# Patient Record
Sex: Female | Born: 1995 | Race: Black or African American | Hispanic: Yes | Marital: Single | Smoking: Never smoker
Health system: Southern US, Community
[De-identification: ages and names within clinical notes are randomized; demographics above are authoritative.]

---
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
## 2017-11-23 ENCOUNTER — Encounter (INDEPENDENT_AMBULATORY_CARE_PROVIDER_SITE_OTHER): Payer: Self-pay

## 2017-11-23 ENCOUNTER — Ambulatory Visit: Payer: BC Managed Care – PPO | Admitting: Internal Medicine

## 2017-11-23 ENCOUNTER — Encounter: Payer: Self-pay | Admitting: Internal Medicine

## 2017-11-23 VITALS — BP 120/80 | HR 91 | Temp 98.4°F | Ht 68.5 in | Wt 161.0 lb

## 2017-11-23 DIAGNOSIS — K644 Residual hemorrhoidal skin tags: Secondary | ICD-10-CM

## 2017-11-23 DIAGNOSIS — Z3042 Encounter for surveillance of injectable contraceptive: Secondary | ICD-10-CM | POA: Diagnosis not present

## 2017-11-23 LAB — POCT URINE PREGNANCY: Preg Test, Ur: NEGATIVE

## 2017-11-23 MED ORDER — MEDROXYPROGESTERONE ACETATE 150 MG/ML IM SUSP
150.0000 mg | Freq: Once | INTRAMUSCULAR | Status: AC
  Administered 2017-11-23: 150 mg via INTRAMUSCULAR

## 2017-11-23 NOTE — Progress Notes (Signed)
HPI  Pt presents to the clinic today to establish care. She has not had a PCP in many years.  She would like to talk about contraception. She recently became sexually active in May. She would like something to prevent pregnancy. She is currently using condoms. She reports she thought her period was late, but it started this morning.  She also reports intermittent rectal pain with blood with wiping. This has been an ongoing issue since childhood but has actually improved. She used to have issues with constipation which has gotten better with age and changes in diet. She has not tried anything OTC for her symptoms.  Flu: never Tetanus: unsure Pap Smear: never Dentist: as needed  No past medical history on file.  Current Outpatient Medications  Medication Sig Dispense Refill  . Magnesium 300 MG CAPS Take by mouth.    . Prenatal Vit-Fe Fumarate-FA (PRENATAL VITAMIN PO) Take by mouth.     No current facility-administered medications for this visit.     No Known Allergies  Family History  Problem Relation Age of Onset  . Breast cancer Maternal Grandmother   . Dementia Maternal Grandmother   . Heart disease Maternal Grandmother   . Asthma Paternal Grandmother     Social History   Socioeconomic History  . Marital status: Single    Spouse name: Not on file  . Number of children: Not on file  . Years of education: Not on file  . Highest education level: Not on file  Occupational History  . Not on file  Social Needs  . Financial resource strain: Not on file  . Food insecurity:    Worry: Not on file    Inability: Not on file  . Transportation needs:    Medical: Not on file    Non-medical: Not on file  Tobacco Use  . Smoking status: Never Smoker  . Smokeless tobacco: Never Used  Substance and Sexual Activity  . Alcohol use: Never    Frequency: Never  . Drug use: Never  . Sexual activity: Not on file  Lifestyle  . Physical activity:    Days per week: Not on file   Minutes per session: Not on file  . Stress: Not on file  Relationships  . Social connections:    Talks on phone: Not on file    Gets together: Not on file    Attends religious service: Not on file    Active member of club or organization: Not on file    Attends meetings of clubs or organizations: Not on file    Relationship status: Not on file  . Intimate partner violence:    Fear of current or ex partner: Not on file    Emotionally abused: Not on file    Physically abused: Not on file    Forced sexual activity: Not on file  Other Topics Concern  . Not on file  Social History Narrative  . Not on file    ROS:  Constitutional: Denies fever, malaise, fatigue, headache or abrupt weight changes.  HEENT: Denies eye pain, eye redness, ear pain, ringing in the ears, wax buildup, runny nose, nasal congestion, bloody nose, or sore throat. Respiratory: Denies difficulty breathing, shortness of breath, cough or sputum production.   Cardiovascular: Denies chest pain, chest tightness, palpitations or swelling in the hands or feet.  Gastrointestinal: Pt reports intermittent constipation and blood in stool Denies abdominal pain, bloating, diarrhea GU: Denies frequency, urgency, pain with urination, blood in urine, odor or  discharge. Musculoskeletal: Denies decrease in range of motion, difficulty with gait, muscle pain or joint pain and swelling.  Skin: Denies redness, rashes, lesions or ulcercations.  Neurological: Denies dizziness, difficulty with memory, difficulty with speech or problems with balance and coordination.  Psych: Denies anxiety, depression, SI/HI.  No other specific complaints in a complete review of systems (except as listed in HPI above).  PE:  BP 120/80   Pulse 91   Temp 98.4 F (36.9 C) (Oral)   Ht 5' 8.5" (1.74 m)   Wt 161 lb (73 kg)   LMP 10/20/2017   SpO2 98%   BMI 24.12 kg/m  Wt Readings from Last 3 Encounters:  11/23/17 161 lb (73 kg)    General: Appears  her stated age, well developed, well nourished in NAD. Cardiovascular: Normal rate and rhythm. S1,S2 noted.  No murmur, rubs or gallops noted.  Pulmonary/Chest: Normal effort and positive vesicular breath sounds. No respiratory distress. No wheezes, rales or ronchi noted.  Abdomen: Soft and nontender. Normal bowel sounds Rectal: External hemorrhoid noted, not bleeding or thrombosed. Neurological: Alert and oriented.  Psychiatric: Mood and affect normal. Behavior is normal. Judgment and thought content normal.    Assessment and Plan:  Encounter for Initiation of Injectable Contraceptive:  Discussed different methods of birth control, side effects, risks Urine preg: negative Depo Provera 150 mg IM today Discussed using backup method of birth control during the first 2 weeks Discussed how this prevents pregnancy but not STD's  External Hemorrhoid:  Discussed adequate fiber and water intake Use stool softener as needed No need for referral to GI at this time  Make an appt for your annual exam Nicki Reaperegina Baity, NP

## 2017-11-23 NOTE — Patient Instructions (Signed)

## 2017-11-23 NOTE — Addendum Note (Signed)
Addended by: Roena MaladyEVONTENNO, Rodina Pinales Y on: 11/23/2017 12:07 PM   Modules accepted: Orders

## 2018-02-03 ENCOUNTER — Ambulatory Visit: Payer: BC Managed Care – PPO

## 2018-02-17 ENCOUNTER — Ambulatory Visit (INDEPENDENT_AMBULATORY_CARE_PROVIDER_SITE_OTHER): Payer: BC Managed Care – PPO

## 2018-02-17 DIAGNOSIS — Z3042 Encounter for surveillance of injectable contraceptive: Secondary | ICD-10-CM

## 2018-02-17 MED ORDER — MEDROXYPROGESTERONE ACETATE 150 MG/ML IM SUSP
150.0000 mg | Freq: Once | INTRAMUSCULAR | Status: AC
  Administered 2018-02-17: 150 mg via INTRAMUSCULAR

## 2018-02-17 NOTE — Progress Notes (Signed)
Administered Depo Provera injection by Domingo CockingAnastasiya Lekisha Mcghee, RMA per order of Nicki Reaperegina Baity, NP. Patient tolerated well. Next Depo due between 05/05/2018 and 05/19/2018.

## 2018-03-02 ENCOUNTER — Telehealth: Payer: Self-pay | Admitting: Internal Medicine

## 2018-03-02 NOTE — Telephone Encounter (Signed)
Pt called office requesting to be seen sooner than the appointment she was scheduled for on 03/10/18. She is scheduled for a CPE, Pap but she states she is sick and needs to be seen sooner. I asked the pt what symptoms is she having and the pt explained she is having vaginal discharge. Please advise if we can get her in sooner.

## 2018-03-02 NOTE — Telephone Encounter (Signed)
Will this Friday at 4:15 work for her physical?

## 2018-03-03 NOTE — Telephone Encounter (Signed)
Yes I was able to get the pt on the schedule for Friday the 6th at 4:15.

## 2018-03-04 ENCOUNTER — Other Ambulatory Visit (HOSPITAL_COMMUNITY)
Admission: RE | Admit: 2018-03-04 | Discharge: 2018-03-04 | Disposition: A | Discharge status: Home / Self Care | Payer: BC Managed Care – PPO | Source: Ambulatory Visit | Attending: Internal Medicine | Admitting: Internal Medicine

## 2018-03-04 ENCOUNTER — Encounter: Payer: Self-pay | Admitting: Internal Medicine

## 2018-03-04 ENCOUNTER — Ambulatory Visit (INDEPENDENT_AMBULATORY_CARE_PROVIDER_SITE_OTHER): Payer: BC Managed Care – PPO | Admitting: Internal Medicine

## 2018-03-04 VITALS — BP 118/76 | HR 84 | Temp 98.9°F | Ht 68.5 in | Wt 165.0 lb

## 2018-03-04 DIAGNOSIS — Z0001 Encounter for general adult medical examination with abnormal findings: Secondary | ICD-10-CM

## 2018-03-04 DIAGNOSIS — Z124 Encounter for screening for malignant neoplasm of cervix: Secondary | ICD-10-CM | POA: Insufficient documentation

## 2018-03-04 DIAGNOSIS — N898 Other specified noninflammatory disorders of vagina: Secondary | ICD-10-CM

## 2018-03-04 DIAGNOSIS — Z23 Encounter for immunization: Secondary | ICD-10-CM

## 2018-03-04 DIAGNOSIS — Z113 Encounter for screening for infections with a predominantly sexual mode of transmission: Secondary | ICD-10-CM | POA: Diagnosis present

## 2018-03-04 MED ORDER — FLUCONAZOLE 150 MG PO TABS: 150.0000 mg | ORAL_TABLET | Freq: Once | ORAL | 0 refills | Status: AC

## 2018-03-04 NOTE — Progress Notes (Signed)
Subjective:    Patient ID: Ashley Mata, female    DOB: [birth year], 22 y.o.   MRN: [id number]  HPI  Pt presents to the clinic today for her annual exam.  Flu: never Tetanus: 08/2006 Pap Smear: never Dentist: as needed  Diet: She does eat meat. She consumes some fruits and veggies. She does eat some fried foods. She drinks mostly water, soda, juice. Exercise: None  Review of Systems      No past medical history on file.  Current Outpatient Medications  Medication Sig Dispense Refill  . Magnesium 300 MG CAPS Take by mouth.    . Prenatal Vit-Fe Fumarate-FA (PRENATAL VITAMIN PO) Take by mouth.     No current facility-administered medications for this visit.     No Known Allergies  Family History  Problem Relation Age of Onset  . Breast cancer Maternal Grandmother   . Dementia Maternal Grandmother   . Heart disease Maternal Grandmother   . Asthma Paternal Grandmother     Social History   Socioeconomic History  . Marital status: Single    Spouse name: Not on file  . Number of children: Not on file  . Years of education: Not on file  . Highest education level: Not on file  Occupational History  . Not on file  Social Needs  . Financial resource strain: Not on file  . Food insecurity:    Worry: Not on file    Inability: Not on file  . Transportation needs:    Medical: Not on file    Non-medical: Not on file  Tobacco Use  . Smoking status: Never Smoker  . Smokeless tobacco: Never Used  Substance and Sexual Activity  . Alcohol use: Never    Frequency: Never  . Drug use: Never  . Sexual activity: Not on file  Lifestyle  . Physical activity:    Days per week: Not on file    Minutes per session: Not on file  . Stress: Not on file  Relationships  . Social connections:    Talks on phone: Not on file    Gets together: Not on file    Attends religious service: Not on file    Active member of club or organization: Not on file    Attends meetings of clubs  or organizations: Not on file    Relationship status: Not on file  . Intimate partner violence:    Fear of current or ex partner: Not on file    Emotionally abused: Not on file    Physically abused: Not on file    Forced sexual activity: Not on file  Other Topics Concern  . Not on file  Social History Narrative  . Not on file     Constitutional: Denies fever, malaise, fatigue, headache or abrupt weight changes.  HEENT: Denies eye pain, eye redness, ear pain, ringing in the ears, wax buildup, runny nose, nasal congestion, bloody nose, or sore throat. Respiratory: Denies difficulty breathing, shortness of breath, cough or sputum production.   Cardiovascular: Denies chest pain, chest tightness, palpitations or swelling in the hands or feet.  Gastrointestinal: Denies abdominal pain, bloating, constipation, diarrhea or blood in the stool.  GU: Pt reports vaginal discharge and itching. Denies urgency, frequency, pain with urination, burning sensation, blood in urine, odor. Musculoskeletal: Denies decrease in range of motion, difficulty with gait, muscle pain or joint pain and swelling.  Skin: Denies redness, rashes, lesions or ulcercations.  Neurological: Denies dizziness, difficulty with memory, difficulty  with speech or problems with balance and coordination.  Psych: Denies anxiety, depression, SI/HI.  No other specific complaints in a complete review of systems (except as listed in HPI above).  Objective:   Physical Exam  BP 118/76   Pulse 84   Temp 98.9 F (37.2 C) (Oral)   Ht 5' 8.5" (1.74 m)   Wt 165 lb (74.8 kg)   LMP 02/15/2018   SpO2 98%   BMI 24.72 kg/m  Wt Readings from Last 3 Encounters:  03/04/18 165 lb (74.8 kg)  11/23/17 161 lb (73 kg)    General: Appears her stated age, well developed, well nourished in NAD. Skin: Warm, dry and intact.  HEENT: Head: normal shape and size; Eyes: sclera white, no icterus, conjunctiva pink, PERRLA and EOMs intact; Ears: Tm's gray  and intact, normal light reflex;  Throat/Mouth: Teeth present, mucosa pink and moist, no exudate, lesions or ulcerations noted.  Neck:  Neck supple, trachea midline. No masses, lumps or thyromegaly present.  Cardiovascular: Normal rate and rhythm. S1,S2 noted.  No murmur, rubs or gallops noted. No JVD or BLE edema.  Pulmonary/Chest: Normal effort and positive vesicular breath sounds. No respiratory distress. No wheezes, rales or ronchi noted.  Abdomen: Soft and nontender. Normal bowel sounds. No distention or masses noted. Liver, spleen and kidneys non palpable. Pelvic: Normal female anatomy. Moderate amount thick white discharge noted. Cervix without changes. No CMT. Adnexa non palpable. Musculoskeletal: Normal range of motion. No signs of joint swelling. No difficulty with gait.  Neurological: Alert and oriented. Cranial nerves II-XII grossly intact. Coordination normal.  Psychiatric: Mood and affect normal. Behavior is normal. Judgment and thought content normal.      Assessment & Plan:   Preventative Health Maintenance:  She declines flu shot Tetanus booster today Pap smear today with STD screening Encouraged her to consume a balanced diet and exercise regimen Advised her to see an eye doctor and dentist annually Will check CBC, CMET, Lipid profile, HIV, RPR and HSV today  Vaginal Itching, Discharge:  STD screening done with pap RX for Diflucan 150 mg PO x 1, repeat in 3 days if needed  Will follow up after labs, return precautions discussed Nicki Reaperegina Avanni Turnbaugh, NP

## 2018-03-04 NOTE — Addendum Note (Signed)
Addended by: Wendi MayaGREESON, Elke Holtry on: 03/04/2018 04:54 PM   Modules accepted: Orders

## 2018-03-04 NOTE — Addendum Note (Signed)
Addended by: Roena MaladyEVONTENNO, Cadin Luka Y on: 03/04/2018 04:57 PM   Modules accepted: Orders

## 2018-03-04 NOTE — Patient Instructions (Signed)

## 2018-03-07 LAB — COMPREHENSIVE METABOLIC PANEL
AG RATIO: 1.9 (calc) (ref 1.0–2.5)
ALKALINE PHOSPHATASE (APISO): 49 U/L (ref 33–115)
ALT: 10 U/L (ref 6–29)
AST: 14 U/L (ref 10–30)
Albumin: 4.8 g/dL (ref 3.6–5.1)
BUN: 15 mg/dL (ref 7–25)
CHLORIDE: 105 mmol/L (ref 98–110)
CO2: 25 mmol/L (ref 20–32)
Calcium: 9.8 mg/dL (ref 8.6–10.2)
Creat: 0.82 mg/dL (ref 0.50–1.10)
GLOBULIN: 2.5 g/dL (ref 1.9–3.7)
Glucose, Bld: 89 mg/dL (ref 65–99)
Potassium: 3.7 mmol/L (ref 3.5–5.3)
Sodium: 138 mmol/L (ref 135–146)
TOTAL PROTEIN: 7.3 g/dL (ref 6.1–8.1)
Total Bilirubin: 0.3 mg/dL (ref 0.2–1.2)

## 2018-03-07 LAB — HIV ANTIBODY (ROUTINE TESTING W REFLEX): HIV 1&2 Ab, 4th Generation: NONREACTIVE

## 2018-03-07 LAB — LIPID PANEL
Cholesterol: 133 mg/dL (ref <200)
HDL: 53 mg/dL (ref >50)
LDL Cholesterol (Calc): 67 mg/dL (calc)
NON-HDL CHOLESTEROL (CALC): 80 mg/dL (ref <130)
Total CHOL/HDL Ratio: 2.5 (calc) (ref <5.0)
Triglycerides: 58 mg/dL (ref <150)

## 2018-03-07 LAB — CBC
HEMATOCRIT: 37.6 % (ref 35.0–45.0)
HEMOGLOBIN: 12.2 g/dL (ref 11.7–15.5)
MCH: 24.8 pg — AB (ref 27.0–33.0)
MCHC: 32.4 g/dL (ref 32.0–36.0)
MCV: 76.4 fL — ABNORMAL LOW (ref 80.0–100.0)
MPV: 10.5 fL (ref 7.5–12.5)
PLATELETS: 372 10*3/uL (ref 140–400)
RBC: 4.92 10*6/uL (ref 3.80–5.10)
RDW: 15 % (ref 11.0–15.0)
WBC: 10 10*3/uL (ref 3.8–10.8)

## 2018-03-07 LAB — HSV(HERPES SIMPLEX VRS) I + II AB-IGG
HAV 1 IGG,TYPE SPECIFIC AB: <0.9 index
HSV 2 IGG,TYPE SPECIFIC AB: <0.9 index

## 2018-03-07 LAB — RPR: RPR Ser Ql: NONREACTIVE

## 2018-03-08 LAB — HSV 1/2 AB (IGM), IFA W/RFLX TITER
HSV 1 IgM Screen: NEGATIVE
HSV 2 IgM Screen: NEGATIVE

## 2018-03-09 ENCOUNTER — Other Ambulatory Visit: Payer: Self-pay | Admitting: Internal Medicine

## 2018-03-09 LAB — CYTOLOGY - PAP
Bacterial vaginitis: POSITIVE — AB
Candida vaginitis: NEGATIVE
Chlamydia: NEGATIVE
Diagnosis: UNDETERMINED — AB
Neisseria Gonorrhea: NEGATIVE
TRICH (WINDOWPATH): NEGATIVE

## 2018-03-09 MED ORDER — METRONIDAZOLE 0.75 % VA GEL: 1.0000 | Freq: Two times a day (BID) | VAGINAL | 0 refills | Status: DC

## 2018-03-10 ENCOUNTER — Encounter

## 2018-03-10 ENCOUNTER — Ambulatory Visit: Payer: BC Managed Care – PPO | Admitting: Internal Medicine

## 2018-03-11 ENCOUNTER — Ambulatory Visit: Payer: BC Managed Care – PPO | Admitting: Internal Medicine

## 2018-04-26 ENCOUNTER — Encounter: Payer: BC Managed Care – PPO | Admitting: Internal Medicine

## 2018-05-05 ENCOUNTER — Ambulatory Visit (INDEPENDENT_AMBULATORY_CARE_PROVIDER_SITE_OTHER): Payer: BC Managed Care – PPO

## 2018-05-05 DIAGNOSIS — Z3042 Encounter for surveillance of injectable contraceptive: Secondary | ICD-10-CM

## 2018-05-05 MED ORDER — MEDROXYPROGESTERONE ACETATE 150 MG/ML IM SUSP
150.0000 mg | Freq: Once | INTRAMUSCULAR | Status: AC
Start: 2018-05-05 — End: 2018-05-05
  Administered 2018-05-05: 150 mg via INTRAMUSCULAR

## 2018-05-05 NOTE — Progress Notes (Signed)
Per orders of Regina Baity, NP, injection of Depo-Provera given by Terrea Bruster. Patient tolerated injection well.  

## 2018-07-28 ENCOUNTER — Other Ambulatory Visit: Payer: Self-pay

## 2018-07-28 ENCOUNTER — Ambulatory Visit (INDEPENDENT_AMBULATORY_CARE_PROVIDER_SITE_OTHER): Payer: BC Managed Care – PPO

## 2018-07-28 DIAGNOSIS — Z3042 Encounter for surveillance of injectable contraceptive: Secondary | ICD-10-CM

## 2018-07-28 MED ORDER — MEDROXYPROGESTERONE ACETATE 150 MG/ML IM SUSP
150.0000 mg | Freq: Once | INTRAMUSCULAR | Status: AC
Start: 2018-07-28 — End: 2018-07-28
  Administered 2018-07-28: 150 mg via INTRAMUSCULAR

## 2018-07-28 NOTE — Progress Notes (Signed)
Per orders of Nicki Reaper, NP injection of  Medroxyprogesterone acetate 150 mg given by Consuella Lose. Patient tolerated injection well besides having soreness in her arm. This was the first time she had this done in her arm. Patient was given a choice of getting this injection in the arm or buttocks area and patient choose in the arm.  Next injection due between these dates 10/13/2018-10/27/2018

## 2018-09-09 ENCOUNTER — Emergency Department (HOSPITAL_COMMUNITY): Payer: BC Managed Care – PPO

## 2018-09-09 ENCOUNTER — Ambulatory Visit: Payer: BC Managed Care – PPO | Admitting: Internal Medicine

## 2018-09-09 ENCOUNTER — Encounter: Payer: Self-pay | Admitting: Internal Medicine

## 2018-09-09 ENCOUNTER — Telehealth: Payer: Self-pay

## 2018-09-09 ENCOUNTER — Other Ambulatory Visit: Payer: Self-pay

## 2018-09-09 ENCOUNTER — Emergency Department (HOSPITAL_COMMUNITY)
Admission: EM | Admit: 2018-09-09 | Discharge: 2018-09-09 | Disposition: A | Discharge status: Home / Self Care | Payer: BC Managed Care – PPO | Attending: Emergency Medicine | Admitting: Emergency Medicine

## 2018-09-09 ENCOUNTER — Encounter (HOSPITAL_COMMUNITY): Payer: Self-pay

## 2018-09-09 VITALS — BP 114/68 | HR 87 | Temp 98.1°F | Wt 169.0 lb

## 2018-09-09 DIAGNOSIS — R0789 Other chest pain: Secondary | ICD-10-CM | POA: Diagnosis not present

## 2018-09-09 DIAGNOSIS — F439 Reaction to severe stress, unspecified: Secondary | ICD-10-CM

## 2018-09-09 DIAGNOSIS — R079 Chest pain, unspecified: Secondary | ICD-10-CM

## 2018-09-09 DIAGNOSIS — Z79899 Other long term (current) drug therapy: Secondary | ICD-10-CM | POA: Insufficient documentation

## 2018-09-09 MED ORDER — OMEPRAZOLE 20 MG PO CPDR: 20.0000 mg | DELAYED_RELEASE_CAPSULE | Freq: Every day | ORAL | 0 refills | Status: DC

## 2018-09-09 NOTE — ED Notes (Signed)
Patient states that her pain is intermittent, but she is currently in no pain. Patient endorses that she has been stressed and anxious lately due to her job. Will continue to monitor patient.

## 2018-09-09 NOTE — Telephone Encounter (Signed)
few days mid chest dull pain especially when moves;sometimes deep breath causes mid chestpain; pt works out and carries heavy bookbag.now mid chest pain more consistent;No N&V,SOB,H/A,dizzy,no covid symptoms; no known exposure to covid;traveled [place] 08/28/18-08/30/18.last wk pt had scratchy throat,?allergies.no scratchy throat today.vaginal discharge, has"yeast"odor, not sure of color and no itching on and off 1 month. tried med [employer] with no relief.ED precautions given/ri

## 2018-09-09 NOTE — ED Notes (Signed)
Patient transported to x-ray. ?

## 2018-09-09 NOTE — ED Triage Notes (Signed)
Pt complains of epigastric type chest pain, she describes it as an intense sensation that happened tonight through the center of her chest

## 2018-09-09 NOTE — ED Provider Notes (Signed)
[facility] DEPT Provider Note   CSN: [id number] Arrival date & time: 09/09/18  0158     History   Chief Complaint Chief Complaint  Patient presents with  . Abdominal Pain    HPI Ashley Mata is a 23 y.o. female.     Patient to ED with complaint of sharp, intense chest pain that occurred while she was lying down for bed tonight. No SOB, difficulty breathing or cough. No recent illness or fever. No pain now. She has been having episodes of similar but less intense pain for the past 1-2 weeks and has a doctor's appointment tomorrow for evaluation. The pain tonight was more significant prompting ED visit. No abdominal pain, nausea, vomiting, congestion, sore throat or headaches. There are no aggravating or alleviating factors.   The history is provided by the patient. No language interpreter was used.  Abdominal Pain Associated symptoms: chest pain   Associated symptoms: no chills, no cough, no fever, no nausea, no shortness of breath and no vomiting     History reviewed. No pertinent past medical history.  There are no active problems to display for this patient.   Past Surgical History:  Procedure Laterality Date  . HERNIA REPAIR  2004     OB History   No obstetric history on file.      Home Medications    Prior to Admission medications   Medication Sig Start Date End Date Taking? Authorizing Provider  Magnesium 300 MG CAPS Take by mouth.    [provider]  metroNIDAZOLE (METROGEL VAGINAL) 0.75 % vaginal gel Place 1 Applicatorful vaginally 2 (two) times daily. 03/09/18   Jearld Fenton, NP  Prenatal Vit-Fe Fumarate-FA (PRENATAL VITAMIN PO) Take by mouth.    [provider]    Family History Family History  Problem Relation Age of Onset  . Breast cancer Maternal Grandmother   . Dementia Maternal Grandmother   . Heart disease Maternal Grandmother   . Asthma Paternal Grandmother     Social History Social  History   Tobacco Use  . Smoking status: Never Smoker  . Smokeless tobacco: Never Used  Substance Use Topics  . Alcohol use: Never    Frequency: Never  . Drug use: Never     Allergies   Patient has no known allergies.   Review of Systems Review of Systems  Constitutional: Negative for chills and fever.  HENT: Negative.   Respiratory: Negative.  Negative for cough and shortness of breath.   Cardiovascular: Positive for chest pain.  Gastrointestinal: Negative for abdominal pain, nausea and vomiting.  Musculoskeletal: Negative.   Skin: Negative.   Neurological: Negative.      Physical Exam Updated Vital Signs BP (!) 133/94 (BP Location: Left Arm)   Pulse 95   Temp 97.9 F (36.6 C) (Oral)   Resp 18   SpO2 100%   Physical Exam Vitals signs and nursing note reviewed.  Constitutional:      Appearance: She is well-developed.  HENT:     Head: Normocephalic.  Neck:     Musculoskeletal: Normal range of motion and neck supple.  Cardiovascular:     Rate and Rhythm: Normal rate and regular rhythm.  Pulmonary:     Effort: Pulmonary effort is normal.     Breath sounds: Normal breath sounds. No wheezing, rhonchi or rales.  Chest:     Chest wall: No tenderness.  Abdominal:     General: Bowel sounds are normal.  Palpations: Abdomen is soft.     Tenderness: There is no abdominal tenderness. There is no guarding or rebound.  Musculoskeletal: Normal range of motion.  Skin:    General: Skin is warm and dry.     Findings: No rash.  Neurological:     Mental Status: She is alert.     Cranial Nerves: No cranial nerve deficit.      ED Treatments / Results  Labs (all labs ordered are listed, but only abnormal results are displayed) Labs Reviewed - No data to display  EKG    Radiology Dg Chest 2 View  Result Date: 09/09/2018 CLINICAL DATA:  23 year old female with chest, epigastric pain. EXAM: CHEST - 2 VIEW COMPARISON:  None. FINDINGS: Normal lung volumes and  mediastinal contours. Visualized tracheal air column is within normal limits. Both lungs appear clear. No pneumothorax or pleural effusion. Negative visible bowel gas and osseous structures. Incidental nipple piercings. IMPRESSION: Negative.  No cardiopulmonary abnormality. Electronically Signed   By: Odessa FlemingH  Hall M.D.   On: 09/09/2018 02:43    Procedures Procedures (including critical care time)  Medications Ordered in ED Medications - No data to display   Initial Impression / Assessment and Plan / ED Course  I have reviewed the triage vital signs and the nursing notes.  Pertinent labs & imaging results that were available during my care of the patient were reviewed by me and considered in my medical decision making (see chart for details).        Patient to ED after having an episode of sharp chest pain tonight after going to bed. 1-2 week history of similar symptoms. No cough, fever, sob.   She is very well appearing. VSS, no hypoxia. CXR shows no evidence of infection.   PE considered. Patient is PERC negative.  Prilosec was suggested as a trial to see if this changed her pain. Rx provided and she will discuss with her doctor at her appointment.    No evidence to suggest a life threatening illness or condition if found. She has follow up with PCP appointment already scheduled for later this morning. She is felt appropriate for discharge home.    Final Clinical Impressions(s) / ED Diagnoses   Final diagnoses:  None   1. Nonspecific chest pain  ED Discharge Orders    None       Elpidio AnisUpstill, Sherlyn Ebbert, PA-C 09/09/18 0602    Gilda CreasePollina, Christopher J, MD 09/09/18 [phone]

## 2018-09-09 NOTE — Discharge Instructions (Addendum)
Keep your scheduled appointment later today for further evaluation of chest pain, and other concerns.   Return to the ED with any new or concerning symptoms.

## 2018-09-09 NOTE — Progress Notes (Signed)
Subjective:    Patient ID: Ashley Mata, female    DOB: 1995-06-12, 23 y.o.   MRN: [id number]  HPI  Pt presents to the clinic today for hospital follow up. She went to the hospital earlier this morning for midsternal chest pain. She reports this has been intermittent over the last week. She describes the pain as aching, tightness. The pain radiates across her chest. It is not worse with movement. She denies dizziness, sweating, nausea, reflux, constipation or diarrhea. She denies cough or shortness of breath. She denies fever, chills or body aches. She did recently travel to [place] but has not had sick contacts that she is aware of. Xray was negative. No labwork or ECG was obtained. They felt like this may be related to reflux. They advised her to start Prilosec OTC but she did not get it because her chest pain has improved. She reports she does not feel overly anxious but she did lose her job last week and that has been stressful for her. She denies depression, SI/HI.  Review of Systems  No past medical history on file.  Current Outpatient Medications  Medication Sig Dispense Refill  . Magnesium 300 MG CAPS Take by mouth.    . metroNIDAZOLE (METROGEL VAGINAL) 0.75 % vaginal gel Place 1 Applicatorful vaginally 2 (two) times daily. 70 g 0  . omeprazole (PRILOSEC) 20 MG capsule Take 1 capsule (20 mg total) by mouth daily. 14 capsule 0  . Prenatal Vit-Fe Fumarate-FA (PRENATAL VITAMIN PO) Take by mouth.     No current facility-administered medications for this visit.     No Known Allergies  Family History  Problem Relation Age of Onset  . Breast cancer Maternal Grandmother   . Dementia Maternal Grandmother   . Heart disease Maternal Grandmother   . Asthma Paternal Grandmother     Social History   Socioeconomic History  . Marital status: Single    Spouse name: Not on file  . Number of children: Not on file  . Years of education: Not on file  . Highest education level: Not  on file  Occupational History  . Not on file  Social Needs  . Financial resource strain: Not on file  . Food insecurity    Worry: Not on file    Inability: Not on file  . Transportation needs    Medical: Not on file    Non-medical: Not on file  Tobacco Use  . Smoking status: Never Smoker  . Smokeless tobacco: Never Used  Substance and Sexual Activity  . Alcohol use: Never    Frequency: Never  . Drug use: Never  . Sexual activity: Not on file  Lifestyle  . Physical activity    Days per week: Not on file    Minutes per session: Not on file  . Stress: Not on file  Relationships  . Social Herbalist on phone: Not on file    Gets together: Not on file    Attends religious service: Not on file    Active member of club or organization: Not on file    Attends meetings of clubs or organizations: Not on file    Relationship status: Not on file  . Intimate partner violence    Fear of current or ex partner: Not on file    Emotionally abused: Not on file    Physically abused: Not on file    Forced sexual activity: Not on file  Other Topics Concern  .  Not on file  Social History Narrative  . Not on file     Constitutional: Denies fever, malaise, fatigue, headache or abrupt weight changes.  HEENT: Denies eye pain, eye redness, ear pain, ringing in the ears, wax buildup, runny nose, nasal congestion, bloody nose, or sore throat. Respiratory: Denies difficulty breathing, shortness of breath, cough or sputum production.   Cardiovascular: Pt reports chest pain (resolved). Denies palpitations or swelling in the hands or feet.  Gastrointestinal: Denies abdominal pain, bloating, constipation, diarrhea or blood in the stool.  GU: Denies urgency, frequency, pain with urination, burning sensation, blood in urine, odor or discharge. Musculoskeletal: Denies decrease in range of motion, difficulty with gait, muscle pain or joint pain and swelling.  Skin: Denies redness, rashes,  lesions or ulcercations.  Neurological: Denies dizziness, difficulty with memory, difficulty with speech or problems with balance and coordination.  Psych: Pt reports recent increase in stress. Denies anxiety, depression, SI/HI.  No other specific complaints in a complete review of systems (except as listed in HPI above).     Objective:   Physical Exam    BP 114/68   Pulse 87   Temp 98.1 F (36.7 C) (Oral)   Wt 169 lb (76.7 kg)   SpO2 98%   BMI 25.32 kg/m  Wt Readings from Last 3 Encounters:  09/09/18 169 lb (76.7 kg)  03/04/18 165 lb (74.8 kg)  11/23/17 161 lb (73 kg)    General: Appears her stated age, well developed, well nourished in NAD. Skin: Warm, dry and intact. No rashes noted. Neck:  Neck supple, trachea midline. No masses, lumps or thyromegaly present.  Cardiovascular: Normal rate and rhythm. S1,S2 noted.  No murmur, rubs or gallops noted.  Pulmonary/Chest: Normal effort and positive vesicular breath sounds. No respiratory distress. No wheezes, rales or ronchi noted.  Abdomen: Soft and nontender. Normal bowel sounds. No distention or masses noted.  Musculoskeletal: Chest wall nontender with palpation.  Neurological: Alert and oriented.   Psychiatric: Mildly anxious appearing.  BMET    Component Value Date/Time   NA 138 03/04/2018 1649   K 3.7 03/04/2018 1649   CL 105 03/04/2018 1649   CO2 25 03/04/2018 1649   GLUCOSE 89 03/04/2018 1649   BUN 15 03/04/2018 1649   CREATININE 0.82 03/04/2018 1649   CALCIUM 9.8 03/04/2018 1649    Lipid Panel     Component Value Date/Time   CHOL 133 03/04/2018 1649   TRIG 58 03/04/2018 1649   HDL 53 03/04/2018 1649   CHOLHDL 2.5 03/04/2018 1649   LDLCALC 67 03/04/2018 1649    CBC    Component Value Date/Time   WBC 10.0 03/04/2018 1649   RBC 4.92 03/04/2018 1649   HGB 12.2 03/04/2018 1649   HCT 37.6 03/04/2018 1649   PLT 372 03/04/2018 1649   MCV 76.4 (L) 03/04/2018 1649   MCH 24.8 (L) 03/04/2018 1649   MCHC  32.4 03/04/2018 1649   RDW 15.0 03/04/2018 1649    Hgb A1C No results found for: HGBA1C        Assessment & Plan:   ER Follow Up for Other Chest Pain:  ER notes and imaging reviewed Seems more like anxiety than reflux Advised her to get the Prilosec OTC and take daily x 1 week, if pain persists despite medication, likely more anxiety and we can discuss treatment at that time Discussed stress relieving techniques, distraction, deep breathing  Return precautions discussed Nicki Reaperegina Mitali Shenefield, NP

## 2018-09-10 ENCOUNTER — Encounter: Payer: Self-pay | Admitting: Internal Medicine

## 2018-09-10 NOTE — Patient Instructions (Signed)

## 2018-10-13 ENCOUNTER — Ambulatory Visit (INDEPENDENT_AMBULATORY_CARE_PROVIDER_SITE_OTHER): Payer: BC Managed Care – PPO

## 2018-10-13 DIAGNOSIS — Z3042 Encounter for surveillance of injectable contraceptive: Secondary | ICD-10-CM | POA: Diagnosis not present

## 2018-10-13 MED ORDER — MEDROXYPROGESTERONE ACETATE 150 MG/ML IM SUSP
150.0000 mg | Freq: Once | INTRAMUSCULAR | Status: AC
  Administered 2018-10-13: 150 mg via INTRAMUSCULAR

## 2018-10-13 NOTE — Progress Notes (Signed)
Per orders of Webb Silversmith, NP, injection Depo Provera 150mg  given by Diamond Nickel, RN.  Administered to left ventrogluteal deep IM.    Patient tolerated injection well.  Patient is in date range for administration today.  Also given dates to schedule next injection due between 10/1 - 10/15.

## 2018-11-21 ENCOUNTER — Other Ambulatory Visit: Payer: Self-pay

## 2018-11-21 ENCOUNTER — Ambulatory Visit: Payer: BC Managed Care – PPO | Admitting: Internal Medicine

## 2018-11-21 ENCOUNTER — Encounter: Payer: Self-pay | Admitting: Internal Medicine

## 2018-11-21 VITALS — BP 122/82 | HR 95 | Temp 98.0°F | Resp 98 | Wt 174.0 lb

## 2018-11-21 DIAGNOSIS — N898 Other specified noninflammatory disorders of vagina: Secondary | ICD-10-CM | POA: Diagnosis not present

## 2018-11-21 NOTE — Patient Instructions (Signed)
Vaginitis  Vaginitis is irritation and swelling (inflammation) of the vagina. It happens when normal bacteria and yeast in the vagina grow too much. There are many types of this condition. Treatment will depend on the type you have. Follow these instructions at home: Lifestyle  Keep your vagina area clean and dry. ? Avoid using soap. ? Rinse the area with water.  Do not do the following until your doctor says it is okay: ? Wash and clean out the vagina (douche). ? Use tampons. ? Have sex.  Wipe from front to back after going to the bathroom.  Let air reach your vagina. ? Wear cotton underwear. ? Do not wear: ? Underwear while you sleep. ? Tight pants. ? Thong underwear. ? Underwear or nylons without a cotton panel. ? Take off any wet clothing, such as bathing suits, as soon as possible.  Use gentle, non-scented products. Do not use things that can irritate the vagina, such as fabric softeners. Avoid the following products if they are scented: ? Feminine sprays. ? Detergents. ? Tampons. ? Feminine hygiene products. ? Soaps or bubble baths.  Practice safe sex and use condoms. General instructions  Take over-the-counter and prescription medicines only as told by your doctor.  If you were prescribed an antibiotic medicine, take or use it as told by your doctor. Do not stop taking or using the antibiotic even if you start to feel better.  Keep all follow-up visits as told by your doctor. This is important. Contact a doctor if:  You have pain in your belly.  You have a fever.  Your symptoms last for more than 2-3 days. Get help right away if:  You have a fever and your symptoms get worse all of a sudden. Summary  Vaginitis is irritation and swelling of the vagina. It can happen when the normal bacteria and yeast in the vagina grow too much. There are many types.  Treatment will depend on the type you have.  Do not douche, use tampons , or have sex until your health  care provider approves. When you can return to sex, practice safe sex and use condoms. This information is not intended to replace advice given to you by your health care provider. Make sure you discuss any questions you have with your health care provider. Document Released: 06/12/2008 Document Revised: 02/26/2017 Document Reviewed: 04/07/2016 Elsevier Patient Education  2020 Elsevier Inc[employer]  

## 2018-11-21 NOTE — Progress Notes (Signed)
Subjective:    Patient ID: Ashley Mata, female    DOB: October 22, 1995, 23 y.o.   MRN: [id number]  HPI  Pt presents to the clinic today with c/o vaginal discharge, irritation and itching. This started 1 week ago. The discharge is clear/white in color. She denies vaginal pain, abnormal bleeding, pain with intercourse or abnormal bleeding. She denies urinary urgency, frequency or dysuria. She has tried Boric Acid suppositories with minimal relief.   Review of Systems      No past medical history on file.  Current Outpatient Medications  Medication Sig Dispense Refill  . omeprazole (PRILOSEC) 20 MG capsule Take 1 capsule (20 mg total) by mouth daily. (Patient not taking: Reported on 09/09/2018) 14 capsule 0   No current facility-administered medications for this visit.     No Known Allergies  Family History  Problem Relation Age of Onset  . Breast cancer Maternal Grandmother   . Dementia Maternal Grandmother   . Heart disease Maternal Grandmother   . Asthma Paternal Grandmother     Social History   Socioeconomic History  . Marital status: Single    Spouse name: Not on file  . Number of children: Not on file  . Years of education: Not on file  . Highest education level: Not on file  Occupational History  . Not on file  Social Needs  . Financial resource strain: Not on file  . Food insecurity    Worry: Not on file    Inability: Not on file  . Transportation needs    Medical: Not on file    Non-medical: Not on file  Tobacco Use  . Smoking status: Never Smoker  . Smokeless tobacco: Never Used  Substance and Sexual Activity  . Alcohol use: Never    Frequency: Never  . Drug use: Never  . Sexual activity: Not on file  Lifestyle  . Physical activity    Days per week: Not on file    Minutes per session: Not on file  . Stress: Not on file  Relationships  . Social Musicianconnections    Talks on phone: Not on file    Gets together: Not on file    Attends religious service:  Not on file    Active member of club or organization: Not on file    Attends meetings of clubs or organizations: Not on file    Relationship status: Not on file  . Intimate partner violence    Fear of current or ex partner: Not on file    Emotionally abused: Not on file    Physically abused: Not on file    Forced sexual activity: Not on file  Other Topics Concern  . Not on file  Social History Narrative  . Not on file     Constitutional: Denies fever, malaise, fatigue, headache or abrupt weight changes.  Gastrointestinal: Denies abdominal pain, bloating, constipation, diarrhea or blood in the stool.  GU: Pt reports vaginal discharge, irritation and itching. Denies urgency, frequency, pain with urination, burning sensation, blood in urine, odor. Skin: Denies redness, rashes, lesions or ulcercations.   No other specific complaints in a complete review of systems (except as listed in HPI above).  Objective:   Physical Exam   BP 122/82   Pulse 95   Temp 98 F (36.7 C) (Temporal)   Resp (!) 98   Wt 174 lb (78.9 kg)   BMI 26.07 kg/m  Wt Readings from Last 3 Encounters:  11/21/18 174 lb (78.9  kg)  09/09/18 169 lb (76.7 kg)  03/04/18 165 lb (74.8 kg)    General: Appears her stated age, well developed, well nourished in NAD. Skin: Warm, dry and intact. No rashes, lesions or ulcerations noted. Cardiovascular: Normal rate and rhythm. S1,S2 noted.  No murmur, rubs or gallops noted.  Pulmonary/Chest: Normal effort and positive vesicular breath sounds. No respiratory distress. No wheezes, rales or ronchi noted.  Abdomen: Soft and nontender. Normal bowel sounds. No distention or masses noted.  Pelvic: Self swabbed. Neurological: Alert and oriented.   BMET    Component Value Date/Time   NA 138 03/04/2018 1649   K 3.7 03/04/2018 1649   CL 105 03/04/2018 1649   CO2 25 03/04/2018 1649   GLUCOSE 89 03/04/2018 1649   BUN 15 03/04/2018 1649   CREATININE 0.82 03/04/2018 1649    CALCIUM 9.8 03/04/2018 1649    Lipid Panel     Component Value Date/Time   CHOL 133 03/04/2018 1649   TRIG 58 03/04/2018 1649   HDL 53 03/04/2018 1649   CHOLHDL 2.5 03/04/2018 1649   LDLCALC 67 03/04/2018 1649    CBC    Component Value Date/Time   WBC 10.0 03/04/2018 1649   RBC 4.92 03/04/2018 1649   HGB 12.2 03/04/2018 1649   HCT 37.6 03/04/2018 1649   PLT 372 03/04/2018 1649   MCV 76.4 (L) 03/04/2018 1649   MCH 24.8 (L) 03/04/2018 1649   MCHC 32.4 03/04/2018 1649   RDW 15.0 03/04/2018 1649    Hgb A1C No results found for: HGBA1C         Assessment & Plan:   Vaginal Discharge, Irritation and Itching:  Will send off wet prep Avoid douching, bubble baths, feminine Avoid Boric Acid for now  Will follow up after labs, return precautions discussed Webb Silversmith, NP

## 2018-11-22 NOTE — Addendum Note (Signed)
Addended by: Lurlean Nanny on: 11/22/2018 10:46 AM   Modules accepted: Orders

## 2018-11-23 LAB — WET PREP BY MOLECULAR PROBE
Candida species: NOT DETECTED
Gardnerella vaginalis: NOT DETECTED
MICRO NUMBER:: 808835
SPECIMEN QUALITY:: ADEQUATE
Trichomonas vaginosis: NOT DETECTED

## 2018-12-29 ENCOUNTER — Ambulatory Visit (INDEPENDENT_AMBULATORY_CARE_PROVIDER_SITE_OTHER): Payer: BC Managed Care – PPO | Admitting: *Deleted

## 2018-12-29 DIAGNOSIS — Z3042 Encounter for surveillance of injectable contraceptive: Secondary | ICD-10-CM

## 2018-12-29 MED ORDER — MEDROXYPROGESTERONE ACETATE 150 MG/ML IM SUSP
150.0000 mg | Freq: Once | INTRAMUSCULAR | Status: AC
  Administered 2018-12-29: 09:00:00 150 mg via INTRAMUSCULAR

## 2018-12-29 NOTE — Progress Notes (Signed)
Per orders of R. Garnette Gunner, NP injection of Depo Provera given by Lauralyn Primes. Patient tolerated injection well.  Patient is in date range for administration today.  Next Injection Due: December 17-31, 2020.

## 2019-03-16 ENCOUNTER — Other Ambulatory Visit: Payer: Self-pay

## 2019-03-16 ENCOUNTER — Encounter: Payer: Self-pay | Admitting: Internal Medicine

## 2019-03-16 ENCOUNTER — Ambulatory Visit (INDEPENDENT_AMBULATORY_CARE_PROVIDER_SITE_OTHER): Payer: BC Managed Care – PPO | Admitting: Internal Medicine

## 2019-03-16 ENCOUNTER — Other Ambulatory Visit (HOSPITAL_COMMUNITY)
Admission: RE | Admit: 2019-03-16 | Discharge: 2019-03-16 | Disposition: A | Discharge status: Home / Self Care | Payer: BC Managed Care – PPO | Source: Ambulatory Visit | Attending: Internal Medicine | Admitting: Internal Medicine

## 2019-03-16 VITALS — BP 128/76 | HR 105 | Temp 97.8°F | Ht 69.0 in | Wt 182.0 lb

## 2019-03-16 DIAGNOSIS — Z3042 Encounter for surveillance of injectable contraceptive: Secondary | ICD-10-CM

## 2019-03-16 DIAGNOSIS — Z113 Encounter for screening for infections with a predominantly sexual mode of transmission: Secondary | ICD-10-CM

## 2019-03-16 DIAGNOSIS — Z124 Encounter for screening for malignant neoplasm of cervix: Secondary | ICD-10-CM | POA: Diagnosis present

## 2019-03-16 DIAGNOSIS — Z0000 Encounter for general adult medical examination without abnormal findings: Secondary | ICD-10-CM

## 2019-03-16 MED ORDER — MEDROXYPROGESTERONE ACETATE 150 MG/ML IM SUSP
150.0000 mg | Freq: Once | INTRAMUSCULAR | Status: AC
  Administered 2019-03-16: 12:00:00 150 mg via INTRAMUSCULAR

## 2019-03-16 NOTE — Patient Instructions (Signed)
Health Maintenance, Female Adopting a healthy lifestyle and getting preventive care are important in promoting health and wellness. Ask your health care provider about:  The right schedule for you to have regular tests and exams.  Things you can do on your own to prevent diseases and keep yourself healthy. What should I know about diet, weight, and exercise? Eat a healthy diet   Eat a diet that includes plenty of vegetables, fruits, low-fat dairy products, and lean protein.  Do not eat a lot of foods that are high in solid fats, added sugars, or sodium. Maintain a healthy weight Body mass index (BMI) is used to identify weight problems. It estimates body fat based on height and weight. Your health care provider can help determine your BMI and help you achieve or maintain a healthy weight. Get regular exercise Get regular exercise. This is one of the most important things you can do for your health. Most adults should:  Exercise for at least 150 minutes each week. The exercise should increase your heart rate and make you sweat (moderate-intensity exercise).  Do strengthening exercises at least twice a week. This is in addition to the moderate-intensity exercise.  Spend less time sitting. Even light physical activity can be beneficial. Watch cholesterol and blood lipids Have your blood tested for lipids and cholesterol at 23 years of age, then have this test every 5 years. Have your cholesterol levels checked more often if:  Your lipid or cholesterol levels are high.  You are older than 23 years of age.  You are at high risk for heart disease. What should I know about cancer screening? Depending on your health history and family history, you may need to have cancer screening at various ages. This may include screening for:  Breast cancer.  Cervical cancer.  Colorectal cancer.  Skin cancer.  Lung cancer. What should I know about heart disease, diabetes, and high blood  pressure? Blood pressure and heart disease  High blood pressure causes heart disease and increases the risk of stroke. This is more likely to develop in people who have high blood pressure readings, are of African descent, or are overweight.  Have your blood pressure checked: ? Every 3-5 years if you are 18-39 years of age. ? Every year if you are 40 years old or older. Diabetes Have regular diabetes screenings. This checks your fasting blood sugar level. Have the screening done:  Once every three years after age 40 if you are at a normal weight and have a low risk for diabetes.  More often and at a younger age if you are overweight or have a high risk for diabetes. What should I know about preventing infection? Hepatitis B If you have a higher risk for hepatitis B, you should be screened for this virus. Talk with your health care provider to find out if you are at risk for hepatitis B infection. Hepatitis C Testing is recommended for:  Everyone born from 1945 through 1965.  Anyone with known risk factors for hepatitis C. Sexually transmitted infections (STIs)  Get screened for STIs, including gonorrhea and chlamydia, if: ? You are sexually active and are younger than 24 years of age. ? You are older than 24 years of age and your health care provider tells you that you are at risk for this type of infection. ? Your sexual activity has changed since you were last screened, and you are at increased risk for chlamydia or gonorrhea. Ask your health care provider if   you are at risk.  Ask your health care provider about whether you are at high risk for HIV. Your health care provider may recommend a prescription medicine to help prevent HIV infection. If you choose to take medicine to prevent HIV, you should first get tested for HIV. You should then be tested every 3 months for as long as you are taking the medicine. Pregnancy  If you are about to stop having your period (premenopausal) and  you may become pregnant, seek counseling before you get pregnant.  Take 400 to 800 micrograms (mcg) of folic acid every day if you become pregnant.  Ask for birth control (contraception) if you want to prevent pregnancy. Osteoporosis and menopause Osteoporosis is a disease in which the bones lose minerals and strength with aging. This can result in bone fractures. If you are 65 years old or older, or if you are at risk for osteoporosis and fractures, ask your health care provider if you should:  Be screened for bone loss.  Take a calcium or vitamin D supplement to lower your risk of fractures.  Be given hormone replacement therapy (HRT) to treat symptoms of menopause. Follow these instructions at home: Lifestyle  Do not use any products that contain nicotine or tobacco, such as cigarettes, e-cigarettes, and chewing tobacco. If you need help quitting, ask your health care provider.  Do not use street drugs.  Do not share needles.  Ask your health care provider for help if you need support or information about quitting drugs. Alcohol use  Do not drink alcohol if: ? Your health care provider tells you not to drink. ? You are pregnant, may be pregnant, or are planning to become pregnant.  If you drink alcohol: ? Limit how much you use to 0-1 drink a day. ? Limit intake if you are breastfeeding.  Be aware of how much alcohol is in your drink. In the [place] one drink equals one 12 oz bottle of beer (355 mL), one 5 oz glass of wine (148 mL), or one 1 oz glass of hard liquor (44 mL). General instructions  Schedule regular health, dental, and eye exams.  Stay current with your vaccines.  Tell your health care provider if: ? You often feel depressed. ? You have ever been abused or do not feel safe at home. Summary  Adopting a healthy lifestyle and getting preventive care are important in promoting health and wellness.  Follow your health care provider's instructions about healthy  diet, exercising, and getting tested or screened for diseases.  Follow your health care provider's instructions on monitoring your cholesterol and blood pressure. This information is not intended to replace advice given to you by your health care provider. Make sure you discuss any questions you have with your health care provider. Document Released: 09/29/2010 Document Revised: 03/09/2018 Document Reviewed: 03/09/2018 Elsevier Patient Education  2020 [employer] Inc.  

## 2019-03-16 NOTE — Addendum Note (Signed)
Addended by: Lurlean Nanny on: 03/16/2019 12:06 PM   Modules accepted: Orders

## 2019-03-16 NOTE — Progress Notes (Signed)
Subjective:    Patient ID: Ashley Mata, female    DOB: 1995-05-18, 23 y.o.   MRN: [id number]  HPI  Pt presents to the clinic today for her annual exam. She is due for her Depo Provera injection. She reports breakthrough bleeding, PMS symptoms and increase in acne when she is due for her shot.  Flu: never Tetanus: 02/2018 Pap Smear: 02/2018, abnormal ASCUS Dentist: as needed  Diet: She does not eat meat. She consumes fruits and veggies when available. She does eat fried foods. She drinks mostly soda. Exercise: Just stopped working due to stress  Review of Systems      No past medical history on file.  Current Outpatient Medications  Medication Sig Dispense Refill  . medroxyPROGESTERone (DEPO-PROVERA) 150 MG/ML injection Inject 150 mg into the muscle every 3 (three) months.    Marland Kitchen omeprazole (PRILOSEC) 20 MG capsule Take 1 capsule (20 mg total) by mouth daily. 14 capsule 0   No current facility-administered medications for this visit.    No Known Allergies  Family History  Problem Relation Age of Onset  . Breast cancer Maternal Grandmother   . Dementia Maternal Grandmother   . Heart disease Maternal Grandmother   . Asthma Paternal Grandmother     Social History   Socioeconomic History  . Marital status: Single    Spouse name: Not on file  . Number of children: Not on file  . Years of education: Not on file  . Highest education level: Not on file  Occupational History  . Not on file  Tobacco Use  . Smoking status: Never Smoker  . Smokeless tobacco: Never Used  Substance and Sexual Activity  . Alcohol use: Never  . Drug use: Never  . Sexual activity: Not on file  Other Topics Concern  . Not on file  Social History Narrative  . Not on file   Social Determinants of Health   Financial Resource Strain:   . Difficulty of Paying Living Expenses: Not on file  Food Insecurity:   . Worried About Charity fundraiser in the Last Year: Not on file  . Ran Out of  Food in the Last Year: Not on file  Transportation Needs:   . Lack of Transportation (Medical): Not on file  . Lack of Transportation (Non-Medical): Not on file  Physical Activity:   . Days of Exercise per Week: Not on file  . Minutes of Exercise per Session: Not on file  Stress:   . Feeling of Stress : Not on file  Social Connections:   . Frequency of Communication with Friends and Family: Not on file  . Frequency of Social Gatherings with Friends and Family: Not on file  . Attends Religious Services: Not on file  . Active Member of Clubs or Organizations: Not on file  . Attends Archivist Meetings: Not on file  . Marital Status: Not on file  Intimate Partner Violence:   . Fear of Current or Ex-Partner: Not on file  . Emotionally Abused: Not on file  . Physically Abused: Not on file  . Sexually Abused: Not on file     Constitutional: Denies fever, malaise, fatigue, headache or abrupt weight changes.  HEENT: Denies eye pain, eye redness, ear pain, ringing in the ears, wax buildup, runny nose, nasal congestion, bloody nose, or sore throat. Respiratory: Denies difficulty breathing, shortness of breath, cough or sputum production.   Cardiovascular: Denies chest pain, chest tightness, palpitations or swelling in the  hands or feet.  Gastrointestinal: Denies abdominal pain, bloating, constipation, diarrhea or blood in the stool.  GU: Pt reports vaginal spotting, dysparuenia. Denies urgency, frequency, pain with urination, burning sensation, blood in urine, odor or discharge. Musculoskeletal: Denies decrease in range of motion, difficulty with gait, muscle pain or joint pain and swelling.  Skin: Pt reports acne. Denies redness, rashes, lesions or ulcercations.  Neurological: Denies dizziness, difficulty with memory, difficulty with speech or problems with balance and coordination.  Psych: Pt reports PMS symptoms. Denies anxiety, depression, SI/HI.  No other specific complaints  in a complete review of systems (except as listed in HPI above).  Objective:   Physical Exam  BP 128/76   Pulse (!) 105   Temp 97.8 F (36.6 C) (Temporal)   Ht 5\' 9"  (1.753 m)   Wt 182 lb (82.6 kg)   SpO2 98%   BMI 26.88 kg/m    Wt Readings from Last 3 Encounters:  11/21/18 174 lb (78.9 kg)  09/09/18 169 lb (76.7 kg)  03/04/18 165 lb (74.8 kg)    General: Appears her stated age, well developed, well nourished in NAD. Skin: Warm, dry and intact. No rashes noted. HEENT: Head: normal shape and size; Eyes: sclera white, no icterus, conjunctiva pink, PERRLA and EOMs intact;  Neck:  Neck supple, trachea midline. No masses, lumps or thyromegaly present.  Cardiovascular: Normal rate and rhythm. S1,S2 noted.  No murmur, rubs or gallops noted. No JVD or BLE edema.  Pulmonary/Chest: Normal effort and positive vesicular breath sounds. No respiratory distress. No wheezes, rales or ronchi noted.  Abdomen: Soft and nontender. Normal bowel sounds. No distention or masses noted. Liver, spleen and kidneys non palpable. Pelvic: Normal female anatomy. Bloody discharge noted in the vaginal vault. Cervix without changes. No CMT. Adnexa non palpable. Musculoskeletal: Strength 5/5 BUE/BLE. No difficulty with gait.  Neurological: Alert and oriented. Cranial nerves II-XII grossly intact. Coordination normal.  Psychiatric: Mood and affect normal. Behavior is normal. Judgment and thought content normal.     BMET    Component Value Date/Time   NA 138 03/04/2018 1649   K 3.7 03/04/2018 1649   CL 105 03/04/2018 1649   CO2 25 03/04/2018 1649   GLUCOSE 89 03/04/2018 1649   BUN 15 03/04/2018 1649   CREATININE 0.82 03/04/2018 1649   CALCIUM 9.8 03/04/2018 1649    Lipid Panel     Component Value Date/Time   CHOL 133 03/04/2018 1649   TRIG 58 03/04/2018 1649   HDL 53 03/04/2018 1649   CHOLHDL 2.5 03/04/2018 1649   LDLCALC 67 03/04/2018 1649    CBC    Component Value Date/Time   WBC 10.0  03/04/2018 1649   RBC 4.92 03/04/2018 1649   HGB 12.2 03/04/2018 1649   HCT 37.6 03/04/2018 1649   PLT 372 03/04/2018 1649   MCV 76.4 (L) 03/04/2018 1649   MCH 24.8 (L) 03/04/2018 1649   MCHC 32.4 03/04/2018 1649   RDW 15.0 03/04/2018 1649    Hgb A1C No results found for: HGBA1C         Assessment & Plan:   Preventative Health Maintenance:  She declines flu shot Tetanus UTD Pap smear today, with STD screening Encouraged her to consume a balanced diet and exercise regimen Advised her to see a dentist annually Labs not indicated at this time  RTC  In 1 year, sooner if needed 14/08/2017, NP  This visit occurred during the SARS-CoV-2 public health emergency.  Safety protocols were in  place, including screening questions prior to the visit, additional usage of staff PPE, and extensive cleaning of exam room while observing appropriate contact time as indicated for disinfecting solutions.

## 2019-03-17 LAB — CYTOLOGY - PAP
Chlamydia: NEGATIVE
Comment: NEGATIVE
Comment: NEGATIVE
Comment: NORMAL
Diagnosis: NEGATIVE
Neisseria Gonorrhea: NEGATIVE
Trichomonas: NEGATIVE

## 2019-06-01 ENCOUNTER — Ambulatory Visit (INDEPENDENT_AMBULATORY_CARE_PROVIDER_SITE_OTHER): Payer: BC Managed Care – PPO

## 2019-06-01 ENCOUNTER — Other Ambulatory Visit: Payer: Self-pay

## 2019-06-01 DIAGNOSIS — Z3042 Encounter for surveillance of injectable contraceptive: Secondary | ICD-10-CM | POA: Diagnosis not present

## 2019-06-01 MED ORDER — MEDROXYPROGESTERONE ACETATE 150 MG/ML IM SUSP
150.0000 mg | Freq: Once | INTRAMUSCULAR | Status: AC
  Administered 2019-06-01: 150 mg via INTRAMUSCULAR

## 2019-06-12 NOTE — Progress Notes (Signed)
Depo provera given by CMA. Chart reviewed. Nicki Reaper, NP

## 2019-08-22 ENCOUNTER — Ambulatory Visit: Payer: BC Managed Care – PPO

## 2019-08-24 ENCOUNTER — Other Ambulatory Visit: Payer: Self-pay

## 2019-08-24 ENCOUNTER — Ambulatory Visit (INDEPENDENT_AMBULATORY_CARE_PROVIDER_SITE_OTHER): Payer: BC Managed Care – PPO | Admitting: *Deleted

## 2019-08-24 DIAGNOSIS — Z3042 Encounter for surveillance of injectable contraceptive: Secondary | ICD-10-CM | POA: Diagnosis not present

## 2019-08-24 MED ORDER — MEDROXYPROGESTERONE ACETATE 150 MG/ML IM SUSP
150.0000 mg | Freq: Once | INTRAMUSCULAR | Status: AC
  Administered 2019-08-24: 150 mg via INTRAMUSCULAR

## 2019-08-24 NOTE — Progress Notes (Signed)
Per orders of Dr.Letvak ( for the absence of Nicki Reaper, NP), injection of medroxyPROGESTERone (DEPO-PROVERA) 150 mg given by Maddilyn Campus. Patient tolerated injection well.

## 2019-09-05 ENCOUNTER — Telehealth: Payer: Self-pay | Admitting: Internal Medicine

## 2019-09-05 NOTE — Telephone Encounter (Signed)
Patient came in office needing a note for work stating she has an intense fear of heights and cant work under specific conditions. I was able to get her scheduled for Thursday to get evaluated for the note!

## 2019-09-07 ENCOUNTER — Ambulatory Visit (INDEPENDENT_AMBULATORY_CARE_PROVIDER_SITE_OTHER): Payer: BC Managed Care – PPO | Admitting: Internal Medicine

## 2019-09-07 ENCOUNTER — Encounter: Payer: Self-pay | Admitting: Internal Medicine

## 2019-09-07 ENCOUNTER — Other Ambulatory Visit: Payer: Self-pay

## 2019-09-07 VITALS — BP 114/76 | HR 84 | Temp 98.2°F | Wt 189.0 lb

## 2019-09-07 DIAGNOSIS — F40241 Acrophobia: Secondary | ICD-10-CM

## 2019-09-07 NOTE — Progress Notes (Signed)
Subjective:    Patient ID: Ashley Mata, female    DOB: November 11, 1995, 24 y.o.   MRN: [id number]  HPI  Patient presents to the clinic today requesting a work accommodation.  She reports fear of heights that started as a young child.  She reports she never put herself in a situation where she had to really deal with heights all that much.  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  She reports anxiety even thinking about getting into this mechanical lift.  She would like to work accommodation form to exempt her from having to use the mechanical lift.  Review of Systems      No past medical history on file.  Current Outpatient Medications  Medication Sig Dispense Refill  . medroxyPROGESTERone (DEPO-PROVERA) 150 MG/ML injection Inject 150 mg into the muscle every 3 (three) months.     No current facility-administered medications for this visit.    No Known Allergies  Family History  Problem Relation Age of Onset  . Breast cancer Maternal Grandmother   . Dementia Maternal Grandmother   . Heart disease Maternal Grandmother   . Asthma Paternal Grandmother     Social History   Socioeconomic History  . Marital status: Single    Spouse name: Not on file  . Number of children: Not on file  . Years of education: Not on file  . Highest education level: Not on file  Occupational History  . Not on file  Tobacco Use  . Smoking status: Never Smoker  . Smokeless tobacco: Never Used  Substance and Sexual Activity  . Alcohol use: Never  . Drug use: Never  . Sexual activity: Not on file  Other Topics Concern  . Not on file  Social History Narrative  . Not on file   Social Determinants of Health   Financial Resource Strain:   . Difficulty of Paying Living Expenses:   Food Insecurity:   . Worried About Charity fundraiser in the Last Year:   . Arboriculturist in the Last  Year:   Transportation Needs:   . Film/video editor (Medical):   Marland Kitchen Lack of Transportation (Non-Medical):   Physical Activity:   . Days of Exercise per Week:   . Minutes of Exercise per Session:   Stress:   . Feeling of Stress :   Social Connections:   . Frequency of Communication with Friends and Family:   . Frequency of Social Gatherings with Friends and Family:   . Attends Religious Services:   . Active Member of Clubs or Organizations:   . Attends Archivist Meetings:   Marland Kitchen Marital Status:   Intimate Partner Violence:   . Fear of Current or Ex-Partner:   . Emotionally Abused:   Marland Kitchen Physically Abused:   . Sexually Abused:      Constitutional: Denies fever, malaise, fatigue, headache or abrupt weight changes.  Respiratory: Denies difficulty breathing, shortness of breath, cough or sputum production.   Cardiovascular: Denies chest pain, chest tightness, palpitations or swelling in the hands or feet.  Neurological: Denies dizziness, difficulty with memory, difficulty with speech or problems with balance and coordination.  Psych: Patient reports fear of heights.  Denies anxiety, depression, SI/HI.  No other specific complaints in a complete review of systems (except as listed in HPI above).  Objective:  Physical Exam BP 114/76   Pulse 84   Temp 98.2 F (36.8 C) (Temporal)   Wt 189 lb (85.7 kg)   SpO2 98%   BMI 27.91 kg/m   Wt Readings from Last 3 Encounters:  03/16/19 182 lb (82.6 kg)  11/21/18 174 lb (78.9 kg)  09/09/18 169 lb (76.7 kg)    General: Appears her stated age, well developed, well nourished in NAD. Cardiovascular: Normal rate. Pulmonary/Chest: Normal effort. Neurological: Alert and oriented.  Psychiatric: Mildly anxious appearing when talking about heights.  BMET    Component Value Date/Time   NA 138 03/04/2018 1649   K 3.7 03/04/2018 1649   CL 105 03/04/2018 1649   CO2 25 03/04/2018 1649   GLUCOSE 89 03/04/2018 1649   BUN 15  03/04/2018 1649   CREATININE 0.82 03/04/2018 1649   CALCIUM 9.8 03/04/2018 1649    Lipid Panel     Component Value Date/Time   CHOL 133 03/04/2018 1649   TRIG 58 03/04/2018 1649   HDL 53 03/04/2018 1649   CHOLHDL 2.5 03/04/2018 1649   LDLCALC 67 03/04/2018 1649    CBC    Component Value Date/Time   WBC 10.0 03/04/2018 1649   RBC 4.92 03/04/2018 1649   HGB 12.2 03/04/2018 1649   HCT 37.6 03/04/2018 1649   PLT 372 03/04/2018 1649   MCV 76.4 (L) 03/04/2018 1649   MCH 24.8 (L) 03/04/2018 1649   MCHC 32.4 03/04/2018 1649   RDW 15.0 03/04/2018 1649    Hgb A1C No results found for: HGBA1C          Assessment & Plan:  Fear of Heights:  Work accommodation form filled out, faxed, original will be mailed to patient  Return precautions discussed   Nicki Reaper, NP This visit occurred during the SARS-CoV-2 public health emergency.  Safety protocols were in place, including screening questions prior to the visit, additional usage of staff PPE, and extensive cleaning of exam room while observing appropriate contact time as indicated for disinfecting solutions.

## 2019-11-16 ENCOUNTER — Ambulatory Visit (INDEPENDENT_AMBULATORY_CARE_PROVIDER_SITE_OTHER): Payer: BC Managed Care – PPO

## 2019-11-16 ENCOUNTER — Other Ambulatory Visit: Payer: Self-pay

## 2019-11-16 DIAGNOSIS — Z3042 Encounter for surveillance of injectable contraceptive: Secondary | ICD-10-CM

## 2019-11-16 MED ORDER — MEDROXYPROGESTERONE ACETATE 150 MG/ML IM SUSP
150.0000 mg | Freq: Once | INTRAMUSCULAR | Status: AC
  Administered 2019-11-16: 150 mg via INTRAMUSCULAR

## 2019-11-16 NOTE — Progress Notes (Signed)
Per orders of Nicki Reaper, NP, injection of Depo-Provera given by Nanci Pina. Patient tolerated injection well.

## 2020-01-17 ENCOUNTER — Telehealth: Payer: Self-pay

## 2020-01-17 NOTE — Telephone Encounter (Signed)
[facility] Night - Client TELEPHONE ADVICE RECORD [facility] Patient Name: Ashley Mata Gender: Female DOB: Jul 08, 1995 Age: 24 Y 2 M 29 D Return Phone Number: [phone] (Primary) Address[place] [address] Client [facility] Night - Client Client Site  [facility] [place] - Night Physician Nicki Reaper - NP Contact Type Call Who Is Calling Patient / Member / Family / Caregiver Call Type Triage / Clinical Relationship To Patient Self Return Phone Number [phone] (Primary) Chief Complaint Wound Infection Reason for Call Request to Schedule Office Appointment Initial Comment Caller needs her DEPO shot and also asks for a referral to be seen by a podiatrist for her one of her toes on her left foot which swells and drains pus on one side of the toenail. Translation No Disp. Time Lamount Cohen Time) Disposition Final User 01/16/2020 5:11:56 PM Attempt made - no message left Gust Brooms 01/16/2020 5:25:09 PM Attempt made - no message left Gust Brooms 01/16/2020 5:35:57 PM FINAL ATTEMPT MADE - no message left Yes Gerre Pebbles, RN, Casimiro Needle Comments User: Phillips Grout, RN Date/Time Lamount Cohen Time): 01/16/2020 5:12:14 PM VM has not been set up yet.

## 2020-02-09 ENCOUNTER — Encounter: Payer: Self-pay | Admitting: Internal Medicine

## 2020-02-09 ENCOUNTER — Ambulatory Visit (INDEPENDENT_AMBULATORY_CARE_PROVIDER_SITE_OTHER): Payer: BC Managed Care – PPO | Admitting: Internal Medicine

## 2020-02-09 ENCOUNTER — Other Ambulatory Visit: Payer: Self-pay

## 2020-02-09 VITALS — BP 122/86 | HR 87 | Temp 98.2°F | Wt 190.0 lb

## 2020-02-09 DIAGNOSIS — S90222A Contusion of left lesser toe(s) with damage to nail, initial encounter: Secondary | ICD-10-CM

## 2020-02-09 DIAGNOSIS — Z3042 Encounter for surveillance of injectable contraceptive: Secondary | ICD-10-CM | POA: Diagnosis not present

## 2020-02-09 DIAGNOSIS — L03032 Cellulitis of left toe: Secondary | ICD-10-CM

## 2020-02-09 MED ORDER — MEDROXYPROGESTERONE ACETATE 150 MG/ML IM SUSP
150.0000 mg | Freq: Once | INTRAMUSCULAR | Status: AC
  Administered 2020-02-09: 150 mg via INTRAMUSCULAR

## 2020-02-09 NOTE — Progress Notes (Signed)
Subjective:    Patient ID: Ashley Mata, female    DOB: April 27, 1995, 24 y.o.   MRN: [id number]  HPI  Pt presents to the clinic today with c/o black discoloration of great toenail, left foot. She noticed this a few months ago after getting a pedicure. She reports the area is not tender, swollen or painful. She has not had any trauma to the nail that she is aware of. She also reports redness, swelling and intermittent pus draining from the edge of the 2nd toenail, left foot. She has not been getting pedicures because of this, but she has not tried any medications OTC.  She is also due for her Depo Provera injection today.   Review of Systems      No past medical history on file.  Current Outpatient Medications  Medication Sig Dispense Refill  . medroxyPROGESTERone (DEPO-PROVERA) 150 MG/ML injection Inject 150 mg into the muscle every 3 (three) months.     No current facility-administered medications for this visit.    No Known Allergies  Family History  Problem Relation Age of Onset  . Breast cancer Maternal Grandmother   . Dementia Maternal Grandmother   . Heart disease Maternal Grandmother   . Asthma Paternal Grandmother     Social History   Socioeconomic History  . Marital status: Single    Spouse name: Not on file  . Number of children: Not on file  . Years of education: Not on file  . Highest education level: Not on file  Occupational History  . Not on file  Tobacco Use  . Smoking status: Never Smoker  . Smokeless tobacco: Never Used  Substance and Sexual Activity  . Alcohol use: Never  . Drug use: Never  . Sexual activity: Not on file  Other Topics Concern  . Not on file  Social History Narrative  . Not on file   Social Determinants of Health   Financial Resource Strain:   . Difficulty of Paying Living Expenses: Not on file  Food Insecurity:   . Worried About Programme researcher, broadcasting/film/video in the Last Year: Not on file  . Ran Out of Food in the Last Year:  Not on file  Transportation Needs:   . Lack of Transportation (Medical): Not on file  . Lack of Transportation (Non-Medical): Not on file  Physical Activity:   . Days of Exercise per Week: Not on file  . Minutes of Exercise per Session: Not on file  Stress:   . Feeling of Stress : Not on file  Social Connections:   . Frequency of Communication with Friends and Family: Not on file  . Frequency of Social Gatherings with Friends and Family: Not on file  . Attends Religious Services: Not on file  . Active Member of Clubs or Organizations: Not on file  . Attends Banker Meetings: Not on file  . Marital Status: Not on file  Intimate Partner Violence:   . Fear of Current or Ex-Partner: Not on file  . Emotionally Abused: Not on file  . Physically Abused: Not on file  . Sexually Abused: Not on file     Constitutional: Denies fever, malaise, fatigue, headache or abrupt weight changes.  Respiratory: Denies difficulty breathing, shortness of breath, cough or sputum production.   Cardiovascular: Denies chest pain, chest tightness, palpitations or swelling in the hands or feet.  Musculoskeletal: Denies decrease in range of motion, difficulty with gait, muscle pain or joint pain and swelling.  Skin:  Pt reports discoloration of left great toenail, intermittent pus draining from 2nd toenail, left foot. Denies redness, rashes, lesions or ulcercations.    No other specific complaints in a complete review of systems (except as listed in HPI above).  Objective:   Physical Exam BP 122/86   Pulse 87   Temp 98.2 F (36.8 C) (Temporal)   Wt 190 lb (86.2 kg)   SpO2 98%   BMI 28.06 kg/m   Wt Readings from Last 3 Encounters:  09/07/19 189 lb (85.7 kg)  03/16/19 182 lb (82.6 kg)  11/21/18 174 lb (78.9 kg)    General: Appears her stated age, well developed, well nourished in NAD. Skin: Small amount of black discoloration, appears to be old blood and lateral edge of left great  toenail. No redness, warmth or pus noted from 2nd toenail, left foot but skin is peeling on the toe- resembling a healing paronychia infection. Cardiovascular: Normal rate and rhythm.  Pulmonary/Chest: Normal effort and positive vesicular breath sounds.  Musculoskeletal: No signs of joint swelling. No difficulty with gait.  Neurological: Alert and oriented.   BMET    Component Value Date/Time   NA 138 03/04/2018 1649   K 3.7 03/04/2018 1649   CL 105 03/04/2018 1649   CO2 25 03/04/2018 1649   GLUCOSE 89 03/04/2018 1649   BUN 15 03/04/2018 1649   CREATININE 0.82 03/04/2018 1649   CALCIUM 9.8 03/04/2018 1649    Lipid Panel     Component Value Date/Time   CHOL 133 03/04/2018 1649   TRIG 58 03/04/2018 1649   HDL 53 03/04/2018 1649   CHOLHDL 2.5 03/04/2018 1649   LDLCALC 67 03/04/2018 1649    CBC    Component Value Date/Time   WBC 10.0 03/04/2018 1649   RBC 4.92 03/04/2018 1649   HGB 12.2 03/04/2018 1649   HCT 37.6 03/04/2018 1649   PLT 372 03/04/2018 1649   MCV 76.4 (L) 03/04/2018 1649   MCH 24.8 (L) 03/04/2018 1649   MCHC 32.4 03/04/2018 1649   RDW 15.0 03/04/2018 1649    Hgb A1C No results found for: HGBA1C           Assessment & Plan:  Discoloration of Left Great Toenail:  Appears to be old blood Advised her to lets see if this grows out over time Offered referral to podiatry for nail removal, but she declines at this time  Paronychia of 2nd Toe, Left Foot:  Infection resolved Advised her not to cut her toenails too short  Will monitor, return precautions discussed   Nicki Reaper, NP This visit occurred during the SARS-CoV-2 public health emergency.  Safety protocols were in place, including screening questions prior to the visit, additional usage of staff PPE, and extensive cleaning of exam room while observing appropriate contact time as indicated for disinfecting solutions.

## 2020-02-12 ENCOUNTER — Encounter: Payer: Self-pay | Admitting: Internal Medicine

## 2020-02-12 NOTE — Patient Instructions (Signed)
Paronychia Paronychia is an infection of the skin. It happens near a fingernail or toenail. It may cause pain and swelling around the nail. In some cases, a fluid-filled bump (abscess) can form near or under the nail. Usually, this condition is not serious, and it clears up with treatment. Follow these instructions at home: Wound care  Keep the affected area clean.  Soak the fingers or toes in warm water as told by your doctor. You may be told to do this for 20 minutes, 2-3 times a day.  Keep the area dry when you are not soaking it.  Do not try to drain a fluid-filled bump on your own.  Follow instructions from your doctor about how to take care of the affected area. Make sure you: ? Wash your hands with soap and water before you change your bandage (dressing). If you cannot use soap and water, use hand sanitizer. ? Change your bandage as told by your doctor.  If you had a fluid-filled bump and your doctor drained it, check the area every day for signs of infection. Check for: ? Redness, swelling, or pain. ? Fluid or blood. ? Warmth. ? Pus or a bad smell. Medicines   Take over-the-counter and prescription medicines only as told by your doctor.  If you were prescribed an antibiotic medicine, take it as told by your doctor. Do not stop taking it even if you start to feel better. General instructions  Avoid touching any chemicals.  Do not pick at the affected area. Prevention  To prevent this condition from happening again: ? Wear rubber gloves when putting your hands in water for washing dishes or other tasks. ? Wear gloves if your hands might touch cleaners or chemicals. ? Avoid injuring your nails or fingertips. ? Do not bite your nails or tear hangnails. ? Do not cut your nails very short. ? Do not cut the skin at the base and sides of the nail (cuticles). ? Use clean nail clippers or scissors when trimming nails. Contact a doctor if:  You feel worse.  You do not get  better.  You have more fluid, blood, or pus coming from the affected area.  Your finger or knuckle is swollen or is hard to move. Get help right away if you have:  A fever or chills.  Redness spreading from the affected area.  Pain in a joint or muscle. Summary  Paronychia is an infection of the skin. It happens near a fingernail or toenail.  This condition may cause pain and swelling around the nail.  Soak the fingers or toes in warm water as told by your doctor.  Usually, this condition is not serious, and it clears up with treatment. This information is not intended to replace advice given to you by your health care provider. Make sure you discuss any questions you have with your health care provider. Document Revised: 04/02/2017 Document Reviewed: 03/29/2017 Elsevier Patient Education  2020 [employer]  

## 2020-04-04 ENCOUNTER — Ambulatory Visit: Payer: Self-pay | Admitting: Internal Medicine

## 2020-04-04 DIAGNOSIS — Z0289 Encounter for other administrative examinations: Secondary | ICD-10-CM

## 2020-04-04 NOTE — Progress Notes (Deleted)
Subjective:    Patient ID: Ashley Mata, female    DOB: [birth year], 25 y.o.   MRN: [id number]  HPI  Patient presents to the clinic today requesting screening for STDs.  Review of Systems      No past medical history on file.  Current Outpatient Medications  Medication Sig Dispense Refill  . medroxyPROGESTERone (DEPO-PROVERA) 150 MG/ML injection Inject 150 mg into the muscle every 3 (three) months.     No current facility-administered medications for this visit.    No Known Allergies  Family History  Problem Relation Age of Onset  . Breast cancer Maternal Grandmother   . Dementia Maternal Grandmother   . Heart disease Maternal Grandmother   . Asthma Paternal Grandmother     Social History   Socioeconomic History  . Marital status: Single    Spouse name: Not on file  . Number of children: Not on file  . Years of education: Not on file  . Highest education level: Not on file  Occupational History  . Not on file  Tobacco Use  . Smoking status: Never Smoker  . Smokeless tobacco: Never Used  Substance and Sexual Activity  . Alcohol use: Never  . Drug use: Never  . Sexual activity: Not on file  Other Topics Concern  . Not on file  Social History Narrative  . Not on file   Social Determinants of Health   Financial Resource Strain: Not on file  Food Insecurity: Not on file  Transportation Needs: Not on file  Physical Activity: Not on file  Stress: Not on file  Social Connections: Not on file  Intimate Partner Violence: Not on file     Constitutional: Denies fever, malaise, fatigue, headache or abrupt weight changes.  Respiratory: Denies difficulty breathing, shortness of breath, cough or sputum production.   Cardiovascular: Denies chest pain, chest tightness, palpitations or swelling in the hands or feet.  Gastrointestinal: Denies abdominal pain, bloating, constipation, diarrhea or blood in the stool.  GU: Denies urgency, frequency, pain with  urination, burning sensation, blood in urine, odor or discharge. Skin: Denies redness, rashes, lesions or ulcercations.   No other specific complaints in a complete review of systems (except as listed in HPI above).  Objective:   Physical Exam  There were no vitals taken for this visit. Wt Readings from Last 3 Encounters:  02/09/20 190 lb (86.2 kg)  09/07/19 189 lb (85.7 kg)  03/16/19 182 lb (82.6 kg)    General: Appears her stated age, well developed, well nourished in NAD. Cardiovascular: Normal rate and rhythm. S1,S2 noted.  No murmur, rubs or gallops noted. No JVD or BLE edema. No carotid bruits noted. Pulmonary/Chest: Normal effort and positive vesicular breath sounds. No respiratory distress. No wheezes, rales or ronchi noted.  Abdomen: Soft and nontender. Normal bowel sounds. No distention or masses noted. Liver, spleen and kidneys non palpable. Pelivc: Deferred. Neurological: Alert and oriented.   BMET    Component Value Date/Time   NA 138 03/04/2018 1649   K 3.7 03/04/2018 1649   CL 105 03/04/2018 1649   CO2 25 03/04/2018 1649   GLUCOSE 89 03/04/2018 1649   BUN 15 03/04/2018 1649   CREATININE 0.82 03/04/2018 1649   CALCIUM 9.8 03/04/2018 1649    Lipid Panel     Component Value Date/Time   CHOL 133 03/04/2018 1649   TRIG 58 03/04/2018 1649   HDL 53 03/04/2018 1649   CHOLHDL 2.5 03/04/2018 1649   LDLCALC 67 03/04/2018  1649    CBC    Component Value Date/Time   WBC 10.0 03/04/2018 1649   RBC 4.92 03/04/2018 1649   HGB 12.2 03/04/2018 1649   HCT 37.6 03/04/2018 1649   PLT 372 03/04/2018 1649   MCV 76.4 (L) 03/04/2018 1649   MCH 24.8 (L) 03/04/2018 1649   MCHC 32.4 03/04/2018 1649   RDW 15.0 03/04/2018 1649    Hgb A1C No results found for: HGBA1C          Assessment & Plan:   Screen for STD:  Will check urine gonorrhea, chlamydia and trichomoniasis Will check HIV, RPR, hep C  We will follow-up after labs are back, return precautions  discussed Nicki Reaper, NP This visit occurred during the SARS-CoV-2 public health emergency.  Safety protocols were in place, including screening questions prior to the visit, additional usage of staff PPE, and extensive cleaning of exam room while observing appropriate contact time as indicated for disinfecting solutions.

## 2020-05-08 ENCOUNTER — Other Ambulatory Visit: Payer: Self-pay

## 2020-05-08 ENCOUNTER — Ambulatory Visit (INDEPENDENT_AMBULATORY_CARE_PROVIDER_SITE_OTHER): Payer: BC Managed Care – PPO

## 2020-05-08 DIAGNOSIS — Z3042 Encounter for surveillance of injectable contraceptive: Secondary | ICD-10-CM | POA: Diagnosis not present

## 2020-05-08 MED ORDER — MEDROXYPROGESTERONE ACETATE 150 MG/ML IM SUSP
150.0000 mg | Freq: Once | INTRAMUSCULAR | Status: AC
  Administered 2020-05-08: 150 mg via INTRAMUSCULAR

## 2020-05-08 NOTE — Progress Notes (Signed)
Per orders of [facility], injection of Depo given by Erby Pian. Patient tolerated injection well. Next injection due between 07/24/20 and 08/07/20.

## 2020-07-30 ENCOUNTER — Ambulatory Visit: Payer: BC Managed Care – PPO

## 2020-07-30 ENCOUNTER — Other Ambulatory Visit: Payer: Self-pay

## 2020-11-16 IMAGING — CR CHEST - 2 VIEW
2 series · 2 of 2 positions shown · non-contrast
Comparison: None.

CLINICAL DATA: 22-year-old female with chest, epigastric pain.

EXAM:
CHEST - 2 VIEW

[w chest lat (1 of 2)]
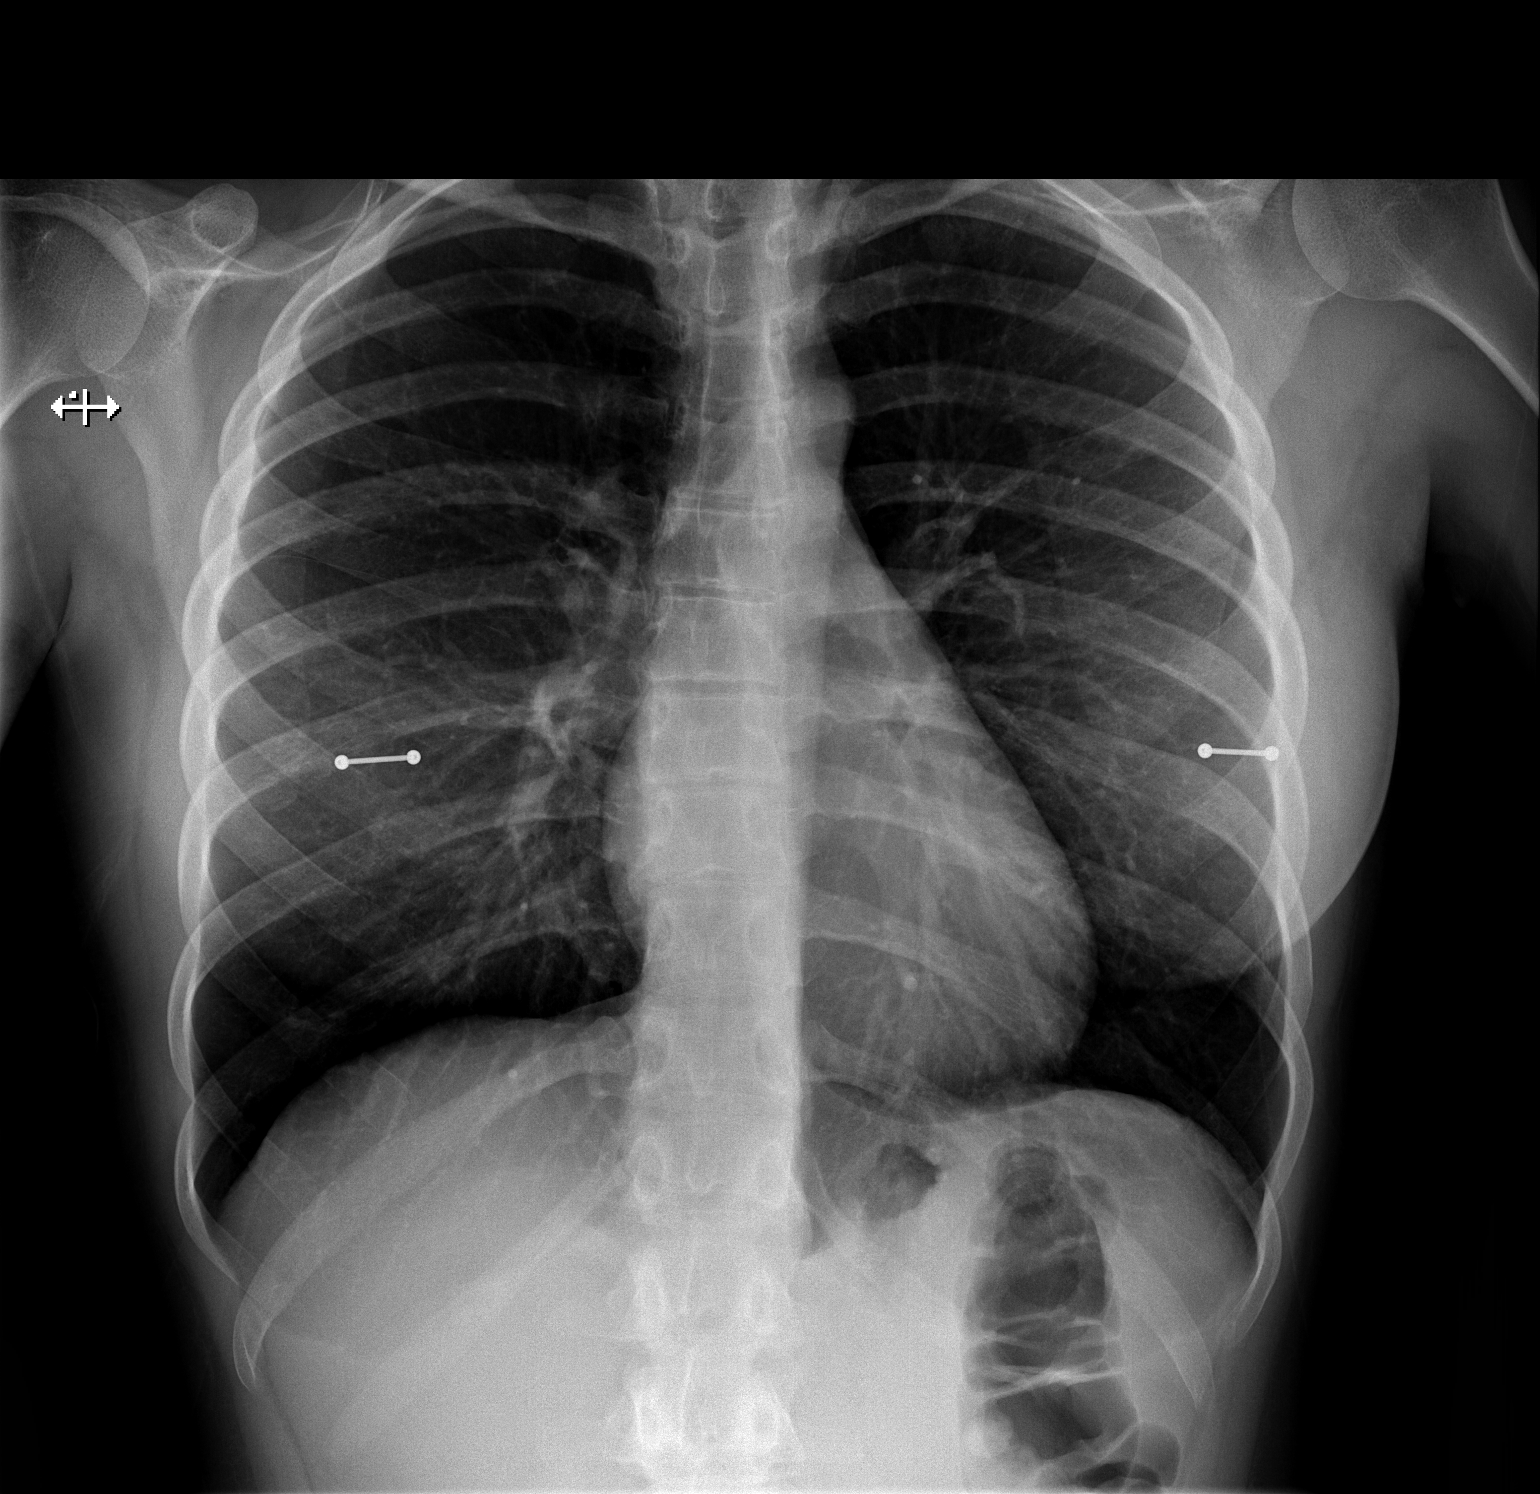

[w chest lat (2 of 2)]
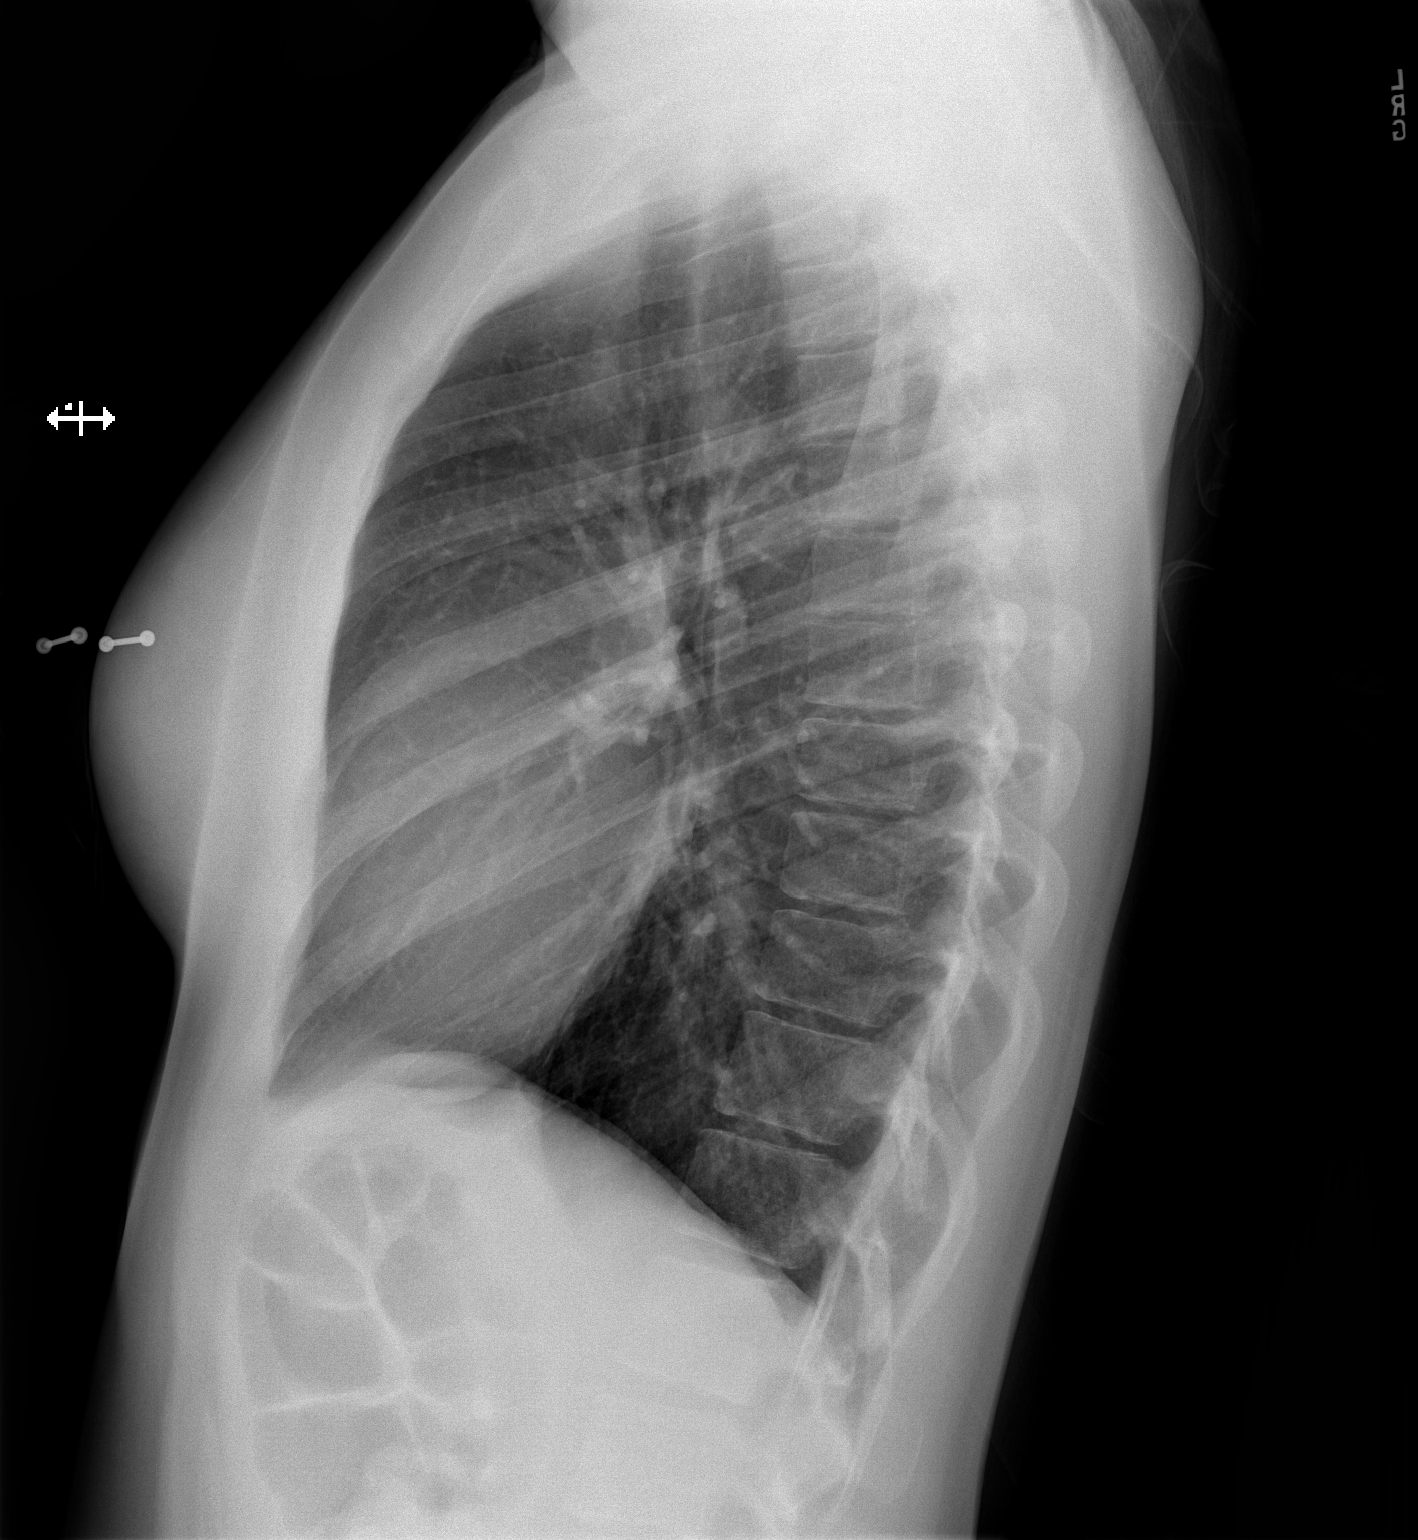

[2 of 2 positions shown; findings below may reference images not displayed]

FINDINGS: Normal lung volumes and mediastinal contours. Visualized tracheal
air column is within normal limits. Both lungs appear clear. No
pneumothorax or pleural effusion. Negative visible bowel gas and
osseous structures. Incidental nipple piercings.
IMPRESSION: Negative.  No cardiopulmonary abnormality.

## 2021-01-16 ENCOUNTER — Ambulatory Visit: Payer: BC Managed Care – PPO

## 2021-02-10 ENCOUNTER — Ambulatory Visit: Payer: BC Managed Care – PPO | Admitting: Obstetrics and Gynecology
